# Patient Record
Sex: Male | Born: 2005 | Race: White | Hispanic: No | Marital: Single | Smoking: Never smoker
Health system: Southern US, Community
[De-identification: ages and names within clinical notes are randomized; demographics above are authoritative.]

## PROBLEM LIST (undated history)

## (undated) HISTORY — PX: STRABISMUS SURGERY: SHX218

---
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
## 2023-10-08 ENCOUNTER — Telehealth: Admitting: Adult Health

## 2023-10-08 ENCOUNTER — Encounter: Payer: Self-pay | Admitting: Adult Health

## 2023-10-08 DIAGNOSIS — F900 Attention-deficit hyperactivity disorder, predominantly inattentive type: Secondary | ICD-10-CM

## 2023-10-08 MED ORDER — AMPHETAMINE-DEXTROAMPHETAMINE 20 MG PO TABS: 20.0000 mg | ORAL_TABLET | Freq: Every day | ORAL | 0 refills | Status: DC

## 2023-10-08 NOTE — Progress Notes (Addendum)
 [facility] MD/PA/NP Initial Note  Virtual Visit via Video Note   I connected with on 10/08/23 at 17:00 AM EDT by a video enabled telemedicine application and verified that I am speaking with the correct person using two identifiers.   I discussed the limitations of evaluation and management by telemedicine and the availability of in person appointments. The patient expressed understanding and agreed to proceed.   I discussed the assessment and treatment plan with the patient. The patient was provided an opportunity to ask questions and all were answered. The patient agreed with the plan and demonstrated an understanding of the instructions.   The patient was advised to call back or seek an in-person evaluation if the symptoms worsen or if the condition fails to improve as anticipated.   I provided 60 minutes of non-face-to-face time during this encounter.  The patient was located at home.  The provider was located at [facility].     Angeline LOISE Sayers, NP    10/08/2023 5:54 PM Blake Yu  MRN:  [id number]  Chief Complaint:   HPI:  Patient seen today for initial psychiatric evaluation.   Describes mood today as ok. Pleasant. Denies tearfulness. Mood symptoms - denies depression. Reports anxiety at times. Reports irritability. Reports stable interest and motivation for the things he wants to do. Denies recent panic attacks.  Reports over thinking a lot. Reports some worry, but not a lot. Reports rumination. Reports getting frustrated a lot - more over little things. Reports mood fluctuates. Reports being tested for ADD in elementary school. His last evaluation was in 2018. Parents evaluation indicated an ADD diagnosis. He also had a psychiatric evaluation with a confirmation of dyslexia. Would like to consider options with current focus and concentration issues. Reports worsening symptoms with starting college this year. Reports he doesn't feel like he did as well as he could  and would like to explore  options to help manage ADD symptoms.  Energy levels not the best. Active, does not have a regular exercise routine.   Enjoys some usual interests and activities. Single. Spending time with family. Appetite adequate. Weight stable - 108 pounds - 65. Sleeps well most nights. Averages 6 to 8 hours. Reports focus and concentration difficulties. 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  Denies AH or VH. Denies self harm. Denies substance use.  Previous medication trials:   Wellbutrin XL 150mg  daily  Visit Diagnosis:    ICD-10-CM   1. Attention deficit hyperactivity disorder (ADHD), predominantly inattentive type  F90.0 amphetamine -dextroamphetamine  (ADDERALL) 20 MG tablet      Past Psychiatric History: Denies psychiatric hospitalization.   Past Medical History: No past medical history on file.  Past Surgical History:  Procedure Laterality Date   STRABISMUS SURGERY     Family Psychiatric History: Reports family history of mental illness.   Family History: No family history on file.  Social History:  Social History   Socioeconomic History   Marital status: Single    Spouse name: Not on file   Number of children: Not on file   Years of education: Not on file   Highest education level: Not on file  Occupational History   Not on file  Tobacco Use   Smoking status: Not on file   Smokeless tobacco: Not on file  Substance and Sexual Activity   Alcohol use: Not on file   Drug use: Not on file   Sexual activity:  Not on file  Other Topics Concern   Not on file  Social History Narrative   Not on file   Social Drivers of Health   Financial Resource Strain: Not on file  Food Insecurity: Not on file  Transportation Needs: Not on file  Physical Activity: Not on file  Stress: Not on file  Social Connections: Not on file    Allergies: No Known  Allergies  Metabolic Disorder Labs: No results found for: HGBA1C, MPG No results found for: PROLACTIN No results found for: CHOL, TRIG, HDL, CHOLHDL, VLDL, LDLCALC No results found for: TSH  Therapeutic Level Labs: No results found for: LITHIUM No results found for: VALPROATE No results found for: CBMZ  Current Medications: Current Outpatient Medications  Medication Sig Dispense Refill   amphetamine -dextroamphetamine  (ADDERALL) 20 MG tablet Take 1 tablet (20 mg total) by mouth daily. 30 tablet 0   Ibuprofen  (MOTRIN  PO) Take 10 mLs by mouth every 6 (six) hours as needed. For pain/fever     No current facility-administered medications for this visit.    Medication Side Effects: none  Orders placed this visit:  No orders of the defined types were placed in this encounter.   Psychiatric Specialty Exam:  Review of Systems  Musculoskeletal:  Negative for gait problem.  Neurological:  Negative for tremors.  Psychiatric/Behavioral:         Please refer to HPI    There were no vitals taken for this visit.There is no height or weight on file to calculate BMI.  General Appearance: Casual and Neat  Eye Contact:  Good  Speech:  Clear and Coherent and Normal Rate  Volume:  Normal  Mood:  Euthymic  Affect:  Appropriate and Congruent  Thought Process:  Coherent and Descriptions of Associations: Intact  Orientation:  Full (Time, Place, and Person)  Thought Content: Logical   Suicidal Thoughts:  No  Homicidal Thoughts:  No  Memory:  WNL  Judgement:  Good  Insight:  Good  Psychomotor Activity:  Normal  Concentration:  Concentration: Good and Attention Span: Good  Recall:  Good  Fund of Knowledge: Good  Language: Good  Assets:  Communication Skills Desire for Improvement Financial Resources/Insurance Housing Intimacy Leisure Time Physical Health Resilience Social Support Talents/Skills Transportation Vocational/Educational  ADL's:  Intact   Cognition: WNL  Prognosis:  Good   Screenings: ADD screener completed.  Receiving Psychotherapy: No   Treatment Plan/Recommendations:   Plan:  PDMP reviewed  Adderall 20mg  daily  RTC 4 weeks  Patient meets DSM criteria for ADD. Screening tools indicated ADHD likely.  60 minutes spent dedicated to the care of this patient on the date of this encounter to include pre-visit review of records, ordering of medication, post visit documentation, and face-to-face time with the patient discussing ADD. Discussed continuing current medication regimen.  Patient advised to contact office with any questions, adverse effects, or acute worsening in signs and symptoms.    Blake Fales N Raheim Beutler, NP

## 2023-10-12 NOTE — Addendum Note (Signed)
 Addended by: LAWERANCE ANGELINE SAILOR on: 10/12/2023 03:24 PM   Modules accepted: Level of Service

## 2023-11-05 ENCOUNTER — Telehealth: Admitting: Adult Health

## 2023-11-05 ENCOUNTER — Encounter: Payer: Self-pay | Admitting: Adult Health

## 2023-11-05 DIAGNOSIS — F900 Attention-deficit hyperactivity disorder, predominantly inattentive type: Secondary | ICD-10-CM | POA: Diagnosis not present

## 2023-11-05 MED ORDER — AMPHETAMINE-DEXTROAMPHET ER 30 MG PO CP24: 30.0000 mg | ORAL_CAPSULE | Freq: Every day | ORAL | 0 refills | Status: DC

## 2023-11-05 NOTE — Progress Notes (Signed)
 Blake Yu [id number] Mar 13, 2006 18 y.o.  Virtual Visit via Video Note  I connected with pt @ on 11/05/23 at  4:00 PM EDT by a video enabled telemedicine application and verified that I am speaking with the correct person using two identifiers.   I discussed the limitations of evaluation and management by telemedicine and the availability of in person appointments. The patient expressed understanding and agreed to proceed.  I discussed the assessment and treatment plan with the patient. The patient was provided an opportunity to ask questions and all were answered. The patient agreed with the plan and demonstrated an understanding of the instructions.   The patient was advised to call back or seek an in-person evaluation if the symptoms worsen or if the condition fails to improve as anticipated.  I provided 20 minutes of non-face-to-face time during this encounter.  The patient was located at home.  The provider was located at [facility].   Angeline LOISE Sayers, NP   Subjective:   Patient ID:  Blake Yu is a 18 y.o. (DOB 09-20-2005) male.  Chief Complaint: No chief complaint on file.   HPI Blake Yu presents for follow-up of ADD.  Describes mood today as ok. Pleasant. Denies tearfulness. Mood symptoms - denies depression. Reports anxiety at times. Reports less irritability. Reports stable interest and motivation. Denies recent panic attacks. Reports decreased over thinking better with medication. Denies worry and rumination. Reports getting less frustrated when on medication. Reports mood as stable. Stating I feel like I'm doing better. Feels like the addition of Adderall has been helpful, but would like to try the XR formula.  Energy levels improved. Active, does not have a regular exercise routine.   Enjoys some usual interests and activities. Single. Spending time with family. Appetite adequate. Weight stable - 108 pounds - 65. Sleeps well most nights. Averages 6  to 8 hours. Reports focus and concentration has improved. 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  Denies AH or VH. Denies self harm. Denies substance use.  Previous medication trials:   Wellbutrin XL 150mg  dail    Review of Systems:  Review of Systems  Musculoskeletal:  Negative for gait problem.  Neurological:  Negative for tremors.  Psychiatric/Behavioral:         Please refer to HPI    Medications: I have reviewed the patient's current medications.  Current Outpatient Medications  Medication Sig Dispense Refill   amphetamine -dextroamphetamine  (ADDERALL XR) 30 MG 24 hr capsule Take 1 capsule (30 mg total) by mouth daily. 30 capsule 0   Ibuprofen  (MOTRIN  PO) Take 10 mLs by mouth every 6 (six) hours as needed. For pain/fever     No current facility-administered medications for this visit.    Medication Side Effects: None  Allergies: No Known Allergies  No past medical history on file.  No family history on file.  Social History   Socioeconomic History   Marital status: Single    Spouse name: Not on file   Number of children: Not on file   Years of education: Not on file   Highest education level: Not on file  Occupational History   Not on file  Tobacco Use   Smoking status: Never   Smokeless tobacco: Not on file  Substance and Sexual Activity   Alcohol use: Not on file   Drug use: Not on file   Sexual activity: Not on file  Other Topics Concern  Not on file  Social History Narrative   Not on file   Social Drivers of Health   Financial Resource Strain: Not on file  Food Insecurity: Not on file  Transportation Needs: Not on file  Physical Activity: Not on file  Stress: Not on file  Social Connections: Not on file  Intimate Partner Violence: Not on file    Past Medical History, Surgical history, Social history, and Family history were reviewed and updated as  appropriate.   Please see review of systems for further details on the patient's review from today.   Objective:   Physical Exam:  There were no vitals taken for this visit.  Physical Exam Constitutional:      General: He is not in acute distress. Musculoskeletal:        General: No deformity.  Neurological:     Mental Status: He is alert and oriented to person, place, and time.     Coordination: Coordination normal.  Psychiatric:        Attention and Perception: Attention and perception normal. He does not perceive auditory or visual hallucinations.        Mood and Affect: Mood normal. Mood is not anxious or depressed. Affect is not labile, blunt, angry or inappropriate.        Speech: Speech normal.        Behavior: Behavior normal.        Thought Content: Thought content normal. Thought content is not paranoid or delusional. Thought content does not include homicidal or suicidal ideation. Thought content does not include homicidal or suicidal plan.        Cognition and Memory: Cognition and memory normal.        Judgment: Judgment normal.     Comments: Insight intact     Lab Review:  No results found for: NA, K, CL, CO2, GLUCOSE, BUN, CREATININE, CALCIUM, PROT, ALBUMIN, AST, ALT, ALKPHOS, BILITOT, GFRNONAA, GFRAA  No results found for: WBC, RBC, HGB, HCT, PLT, MCV, MCH, MCHC, RDW, LYMPHSABS, MONOABS, EOSABS, BASOSABS  No results found for: POCLITH, LITHIUM   No results found for: PHENYTOIN, PHENOBARB, VALPROATE, CBMZ   .res Assessment: Plan:    Treatment Plan/Recommendations:   Plan:  PDMP reviewed  D/C Adderall 20mg  daily  Add Adderall XR 30mg  daily  RTC 4 weeks  Patient meets DSM criteria for ADD. Screening tools indicated ADHD likely.  20 minutes spent dedicated to the care of this patient on the date of this encounter to include pre-visit review of records, ordering of medication, post  visit documentation, and face-to-face time with the patient discussing ADD. Discussed continuing current medication regimen.  Patient advised to contact office with any questions, adverse effects, or acute worsening in signs and symptoms.     Diagnoses and all orders for this visit:  Attention deficit hyperactivity disorder (ADHD), predominantly inattentive type -     amphetamine -dextroamphetamine  (ADDERALL XR) 30 MG 24 hr capsule; Take 1 capsule (30 mg total) by mouth daily.     Please see After Visit Summary for patient specific instructions.  No future appointments.  No orders of the defined types were placed in this encounter.     -------------------------------

## 2023-12-08 ENCOUNTER — Telehealth (INDEPENDENT_AMBULATORY_CARE_PROVIDER_SITE_OTHER): Admitting: Adult Health

## 2023-12-08 ENCOUNTER — Encounter: Payer: Self-pay | Admitting: Adult Health

## 2023-12-08 DIAGNOSIS — F411 Generalized anxiety disorder: Secondary | ICD-10-CM | POA: Diagnosis not present

## 2023-12-08 DIAGNOSIS — F900 Attention-deficit hyperactivity disorder, predominantly inattentive type: Secondary | ICD-10-CM

## 2023-12-08 MED ORDER — AMPHETAMINE-DEXTROAMPHET ER 30 MG PO CP24: 30.0000 mg | ORAL_CAPSULE | Freq: Every day | ORAL | 0 refills | Status: AC

## 2023-12-08 MED ORDER — PROPRANOLOL HCL 10 MG PO TABS: 10.0000 mg | ORAL_TABLET | Freq: Three times a day (TID) | ORAL | 0 refills | Status: DC

## 2023-12-08 MED ORDER — AMPHETAMINE-DEXTROAMPHET ER 30 MG PO CP24: 30.0000 mg | ORAL_CAPSULE | Freq: Every day | ORAL | 0 refills | Status: DC

## 2023-12-08 NOTE — Progress Notes (Signed)
 Blake Yu [id number] [birth year] 18 y.o.  Virtual Visit via Video Note  I connected with pt @ on 12/08/23 at  4:00 PM EDT by a video enabled telemedicine application and verified that I am speaking with the correct person using two identifiers.   I discussed the limitations of evaluation and management by telemedicine and the availability of in person appointments. The patient expressed understanding and agreed to proceed.  I discussed the assessment and treatment plan with the patient. The patient was provided an opportunity to ask questions and all were answered. The patient agreed with the plan and demonstrated an understanding of the instructions.   The patient was advised to call back or seek an in-person evaluation if the symptoms worsen or if the condition fails to improve as anticipated.  I provided 20 minutes of non-face-to-face time during this encounter.  The patient was located at home.  The provider was located at [facility].   Blake LOISE Sayers, NP   Subjective:   Patient ID:  Blake Yu is a 18 y.o. (DOB 05/21/2005) male.  Chief Complaint: No chief complaint on file.   HPI Blake Yu presents for follow-up of ADD.  Describes mood today as ok. Pleasant. Denies tearfulness. Mood symptoms - denies depression. Reports anxiety at times - social. Reports decreased irritability - only on days I don't take the Adderall. Reports stable interest and motivation. Denies recent panic attacks. Reports decreased over thinking. Denies worry and rumination. Reports some frustration. Reports mood as stable. Stating I feel like I'm doing ok - getting adjusted. Feels like the addition of Adderall XR formula works well. Energy levels improved. Active, does not have a regular exercise routine.   Enjoys some usual interests and activities. Single. Living with roommates. Spending time with family. Appetite adequate. Weight stable - 108 pounds - 65. Sleeps well most nights.  Averages 6 to 8 hours. Reports focus and concentration has improved. 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  Denies AH or VH. Denies self harm. Denies substance use.  Previous medication trials:   Wellbutrin XL 150mg  dail  Review of Systems:  Review of Systems  Musculoskeletal:  Negative for gait problem.  Neurological:  Negative for tremors.  Psychiatric/Behavioral:         Please refer to HPI    Medications: I have reviewed the patient's current medications.  Current Outpatient Medications  Medication Sig Dispense Refill   [START ON 01/05/2024] amphetamine -dextroamphetamine  (ADDERALL XR) 30 MG 24 hr capsule Take 1 capsule (30 mg total) by mouth daily. 30 capsule 0   [START ON 02/02/2024] amphetamine -dextroamphetamine  (ADDERALL XR) 30 MG 24 hr capsule Take 1 capsule (30 mg total) by mouth daily. 30 capsule 0   propranolol  (INDERAL ) 10 MG tablet Take 1 tablet (10 mg total) by mouth 3 (three) times daily. 90 tablet 0   amphetamine -dextroamphetamine  (ADDERALL XR) 30 MG 24 hr capsule Take 1 capsule (30 mg total) by mouth daily. 30 capsule 0   Ibuprofen  (MOTRIN  PO) Take 10 mLs by mouth every 6 (six) hours as needed. For pain/fever     No current facility-administered medications for this visit.    Medication Side Effects: None  Allergies: No Known Allergies  No past medical history on file.  No family history on file.  Social History   Socioeconomic History   Marital status: Single    Spouse name: Not on file   Number of children: Not  on file   Years of education: Not on file   Highest education level: Not on file  Occupational History   Not on file  Tobacco Use   Smoking status: Never   Smokeless tobacco: Not on file  Substance and Sexual Activity   Alcohol use: Not on file   Drug use: Not on file   Sexual activity: Not on file  Other Topics Concern   Not on file  Social  History Narrative   Not on file   Social Drivers of Health   Financial Resource Strain: Not on file  Food Insecurity: Not on file  Transportation Needs: Not on file  Physical Activity: Not on file  Stress: Not on file  Social Connections: Not on file  Intimate Partner Violence: Not on file    Past Medical History, Surgical history, Social history, and Family history were reviewed and updated as appropriate.   Please see review of systems for further details on the patient's review from today.   Objective:   Physical Exam:  There were no vitals taken for this visit.  Physical Exam Constitutional:      General: He is not in acute distress. Musculoskeletal:        General: No deformity.  Neurological:     Mental Status: He is alert and oriented to person, place, and time.     Coordination: Coordination normal.  Psychiatric:        Attention and Perception: Attention and perception normal. He does not perceive auditory or visual hallucinations.        Mood and Affect: Mood normal. Mood is not anxious or depressed. Affect is not labile, blunt, angry or inappropriate.        Speech: Speech normal.        Behavior: Behavior normal.        Thought Content: Thought content normal. Thought content is not paranoid or delusional. Thought content does not include homicidal or suicidal ideation. Thought content does not include homicidal or suicidal plan.        Cognition and Memory: Cognition and memory normal.        Judgment: Judgment normal.     Comments: Insight intact     Lab Review:  No results found for: NA, K, CL, CO2, GLUCOSE, BUN, CREATININE, CALCIUM, PROT, ALBUMIN, AST, ALT, ALKPHOS, BILITOT, GFRNONAA, GFRAA  No results found for: WBC, RBC, HGB, HCT, PLT, MCV, MCH, MCHC, RDW, LYMPHSABS, MONOABS, EOSABS, BASOSABS  No results found for: POCLITH, LITHIUM   No results found for: PHENYTOIN, PHENOBARB,  VALPROATE, CBMZ   .res Assessment: Plan:    Treatment Plan/Recommendations:   Plan:  PDMP reviewed  Adderall XR 30mg  daily - sent 3 months supply  Add Propranolol  10mg  TID prn anxiety  RTC 4 weeks  Patient meets DSM criteria for ADD. Screening tools indicated ADHD likely.  20 minutes spent dedicated to the care of this patient on the date of this encounter to include pre-visit review of records, ordering of medication, post visit documentation, and face-to-face time with the patient discussing ADD. Discussed continuing current medication regimen.  Patient advised to contact office with any questions, adverse effects, or acute worsening in signs and symptoms.    Diagnoses and all orders for this visit:  Attention deficit hyperactivity disorder (ADHD), predominantly inattentive type -     amphetamine -dextroamphetamine  (ADDERALL XR) 30 MG 24 hr capsule; Take 1 capsule (30 mg total) by mouth daily. -     amphetamine -dextroamphetamine  (ADDERALL XR) 30 MG  24 hr capsule; Take 1 capsule (30 mg total) by mouth daily. -     amphetamine -dextroamphetamine  (ADDERALL XR) 30 MG 24 hr capsule; Take 1 capsule (30 mg total) by mouth daily.  Generalized anxiety disorder -     propranolol  (INDERAL ) 10 MG tablet; Take 1 tablet (10 mg total) by mouth 3 (three) times daily.     Please see After Visit Summary for patient specific instructions.  No future appointments.   No orders of the defined types were placed in this encounter.     -------------------------------

## 2023-12-10 ENCOUNTER — Telehealth: Payer: Self-pay | Admitting: Adult Health

## 2023-12-10 DIAGNOSIS — F411 Generalized anxiety disorder: Secondary | ICD-10-CM

## 2023-12-10 NOTE — Telephone Encounter (Signed)
 Pt wanted to speak with [facility] about the dosing of his Adderall. Wanting to know if he can get booster for Adderall and SSRI.

## 2023-12-11 ENCOUNTER — Other Ambulatory Visit: Payer: Self-pay

## 2023-12-11 DIAGNOSIS — F900 Attention-deficit hyperactivity disorder, predominantly inattentive type: Secondary | ICD-10-CM

## 2023-12-11 MED ORDER — AMPHETAMINE-DEXTROAMPHETAMINE 10 MG PO TABS: 10.0000 mg | ORAL_TABLET | Freq: Every day | ORAL | 0 refills | Status: DC

## 2023-12-11 MED ORDER — FLUOXETINE HCL 20 MG PO CAPS: 20.0000 mg | ORAL_CAPSULE | Freq: Every day | ORAL | 0 refills | Status: DC

## 2023-12-11 NOTE — Telephone Encounter (Signed)
 Sent in Rx for Prozac , pended Adderall.

## 2023-12-11 NOTE — Telephone Encounter (Signed)
 Pt just seen earlier this week. He is asking for a boost of Adderall IR, doesn't feel like the current 30 XR dose is strong enough. He is also asking for an SSRI. He said at appt he elected to try propranolol , but doesn't feel like it is addressing his anxiety. He rates anxiety as 7-9/10. He reports it is 7 on a daily basis and 9 situational. He reports he has stopped caffeine. He is sleeping 6-8 hours.

## 2024-01-05 ENCOUNTER — Encounter: Payer: Self-pay | Admitting: Adult Health

## 2024-01-05 ENCOUNTER — Telehealth: Admitting: Adult Health

## 2024-01-05 DIAGNOSIS — F900 Attention-deficit hyperactivity disorder, predominantly inattentive type: Secondary | ICD-10-CM

## 2024-01-05 DIAGNOSIS — F411 Generalized anxiety disorder: Secondary | ICD-10-CM | POA: Diagnosis not present

## 2024-01-05 DIAGNOSIS — F909 Attention-deficit hyperactivity disorder, unspecified type: Secondary | ICD-10-CM

## 2024-01-05 MED ORDER — PROPRANOLOL HCL 10 MG PO TABS: 10.0000 mg | ORAL_TABLET | Freq: Three times a day (TID) | ORAL | 2 refills | Status: DC

## 2024-01-05 MED ORDER — FLUOXETINE HCL 20 MG PO CAPS: 20.0000 mg | ORAL_CAPSULE | Freq: Every day | ORAL | 2 refills | Status: DC

## 2024-01-05 MED ORDER — AMPHETAMINE-DEXTROAMPHETAMINE 10 MG PO TABS: 10.0000 mg | ORAL_TABLET | Freq: Every day | ORAL | 0 refills | Status: DC

## 2024-01-05 NOTE — Progress Notes (Signed)
 Blake Yu [id number] 13-Dec-2005 18 y.o.  Virtual Visit via Video Note  I connected with pt @ on 01/05/24 at  4:30 PM EDT by a video enabled telemedicine application and verified that I am speaking with the correct person using two identifiers.   I discussed the limitations of evaluation and management by telemedicine and the availability of in person appointments. The patient expressed understanding and agreed to proceed.  I discussed the assessment and treatment plan with the patient. The patient was provided an opportunity to ask questions and all were answered. The patient agreed with the plan and demonstrated an understanding of the instructions.   The patient was advised to call back or seek an in-person evaluation if the symptoms worsen or if the condition fails to improve as anticipated.  I provided 25 minutes of non-face-to-face time during this encounter.  The patient was located at home.  The provider was located at [facility].   Angeline LOISE Sayers, NP   Subjective:   Patient ID:  Blake Yu is a 18 y.o. (DOB [birth year]) male.  Chief Complaint: No chief complaint on file.   HPI Blake Yu presents for follow-up of ADD and GAD.  Describes mood today as ok. Pleasant. Denies tearfulness. Mood symptoms - reports decreased depression. Reports the general anxiety he was feeling has gone away. Denies panic attacks. Reports decreased irritability - feel more patient. Reports stable interest and motivation - overall good. Denies over thinking. Denies worry and rumination. Reports some frustration - over little stuff.  Reports mood as stable. Stating I feel like I'm doing ok. Feels like the current medication regimen is helpful. Taking medication as prescribing. Energy levels stable. Active, does not have a regular exercise routine.   Enjoys some usual interests and activities. Single. Living with roommates. Spending time with family. Appetite adequate. Weight  stable - 108 to 105 pounds - 65. Sleeps well most nights. Averages 6 to 8 hours. Reports focus and concentration is stable with current medication regimen. 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  Denies AH or VH. Denies self harm. Denies substance use.  Previous medication trials:   Wellbutrin XL 150mg  dail  Review of Systems:  Review of Systems  Musculoskeletal:  Negative for gait problem.  Neurological:  Negative for tremors.  Psychiatric/Behavioral:         Please refer to HPI    Medications: I have reviewed the patient's current medications.  Current Outpatient Medications  Medication Sig Dispense Refill   amphetamine -dextroamphetamine  (ADDERALL XR) 30 MG 24 hr capsule Take 1 capsule (30 mg total) by mouth daily. 30 capsule 0   amphetamine -dextroamphetamine  (ADDERALL XR) 30 MG 24 hr capsule Take 1 capsule (30 mg total) by mouth daily. 30 capsule 0   [START ON 02/02/2024] amphetamine -dextroamphetamine  (ADDERALL XR) 30 MG 24 hr capsule Take 1 capsule (30 mg total) by mouth daily. 30 capsule 0   amphetamine -dextroamphetamine  (ADDERALL) 10 MG tablet Take 1 tablet (10 mg total) by mouth daily at 12 noon. 30 tablet 0   FLUoxetine  (PROZAC ) 20 MG capsule Take 1 capsule (20 mg total) by mouth daily. 30 capsule 0   Ibuprofen  (MOTRIN  PO) Take 10 mLs by mouth every 6 (six) hours as needed. For pain/fever     propranolol  (INDERAL ) 10 MG tablet Take 1 tablet (10 mg total) by mouth 3 (three) times daily. 90 tablet 0   No current facility-administered medications for this visit.  Medication Side Effects: None  Allergies: No Known Allergies  No past medical history on file.  No family history on file.  Social History   Socioeconomic History   Marital status: Single    Spouse name: Not on file   Number of children: Not on file   Years of education: Not on file   Highest education level: Not  on file  Occupational History   Not on file  Tobacco Use   Smoking status: Never   Smokeless tobacco: Not on file  Substance and Sexual Activity   Alcohol use: Not on file   Drug use: Not on file   Sexual activity: Not on file  Other Topics Concern   Not on file  Social History Narrative   Not on file   Social Drivers of Health   Financial Resource Strain: Not on file  Food Insecurity: Not on file  Transportation Needs: Not on file  Physical Activity: Not on file  Stress: Not on file  Social Connections: Not on file  Intimate Partner Violence: Not on file    Past Medical History, Surgical history, Social history, and Family history were reviewed and updated as appropriate.   Please see review of systems for further details on the patient's review from today.   Objective:   Physical Exam:  There were no vitals taken for this visit.  Physical Exam Constitutional:      General: He is not in acute distress. Musculoskeletal:        General: No deformity.  Neurological:     Mental Status: He is alert and oriented to person, place, and time.     Coordination: Coordination normal.  Psychiatric:        Attention and Perception: Attention and perception normal. He does not perceive auditory or visual hallucinations.        Mood and Affect: Mood normal. Mood is not anxious or depressed. Affect is not labile, blunt, angry or inappropriate.        Speech: Speech normal.        Behavior: Behavior normal.        Thought Content: Thought content normal. Thought content is not paranoid or delusional. Thought content does not include homicidal or suicidal ideation. Thought content does not include homicidal or suicidal plan.        Cognition and Memory: Cognition and memory normal.        Judgment: Judgment normal.     Comments: Insight intact     Lab Review:  No results found for: NA, K, CL, CO2, GLUCOSE, BUN, CREATININE, CALCIUM, PROT, ALBUMIN, AST,  ALT, ALKPHOS, BILITOT, GFRNONAA, GFRAA  No results found for: WBC, RBC, HGB, HCT, PLT, MCV, MCH, MCHC, RDW, LYMPHSABS, MONOABS, EOSABS, BASOSABS  No results found for: POCLITH, LITHIUM   No results found for: PHENYTOIN, PHENOBARB, VALPROATE, CBMZ   .res Assessment: Plan:    Treatment Plan/Recommendations:   Plan:  PDMP reviewed  Adderall XR 30mg  daily - sent 3 months supply Adderall 10mg  daily Propranolol  10mg  TID prn anxiety Prozac  20mg  daily  RTC 4 weeks  Patient meets DSM criteria for ADD. Screening tools indicated ADHD likely.  20 minutes spent dedicated to the care of this patient on the date of this encounter to include pre-visit review of records, ordering of medication, post visit documentation, and face-to-face time with the patient discussing ADD. Discussed continuing current medication regimen.  Patient advised to contact office with any questions, adverse effects, or acute worsening in signs and  symptoms.  There are no diagnoses linked to this encounter.   Please see After Visit Summary for patient specific instructions.  Future Appointments  Date Time Provider Department Center  01/05/2024  4:30 PM Kj Imbert Nattalie, NP [facility] None    No orders of the defined types were placed in this encounter.     -------------------------------

## 2024-02-03 ENCOUNTER — Encounter: Payer: Self-pay | Admitting: Adult Health

## 2024-02-03 ENCOUNTER — Telehealth: Admitting: Adult Health

## 2024-02-03 DIAGNOSIS — F411 Generalized anxiety disorder: Secondary | ICD-10-CM

## 2024-02-03 DIAGNOSIS — F909 Attention-deficit hyperactivity disorder, unspecified type: Secondary | ICD-10-CM | POA: Diagnosis not present

## 2024-02-03 DIAGNOSIS — F900 Attention-deficit hyperactivity disorder, predominantly inattentive type: Secondary | ICD-10-CM

## 2024-02-03 MED ORDER — FLUOXETINE HCL 40 MG PO CAPS: 40.0000 mg | ORAL_CAPSULE | Freq: Every day | ORAL | 2 refills | Status: DC

## 2024-02-03 MED ORDER — PROPRANOLOL HCL 10 MG PO TABS: 10.0000 mg | ORAL_TABLET | Freq: Three times a day (TID) | ORAL | 2 refills | Status: DC

## 2024-02-03 MED ORDER — AMPHETAMINE-DEXTROAMPHETAMINE 10 MG PO TABS: 10.0000 mg | ORAL_TABLET | Freq: Every day | ORAL | 0 refills | Status: DC

## 2024-02-03 NOTE — Progress Notes (Signed)
 Blake Yu [id number] 01-Dec-2005 18 y.o.  Virtual Visit via Video Note  I connected with pt @ on 02/03/24 at  4:30 PM EDT by a video enabled telemedicine application and verified that I am speaking with the correct person using two identifiers.   I discussed the limitations of evaluation and management by telemedicine and the availability of in person appointments. The patient expressed understanding and agreed to proceed.  I discussed the assessment and treatment plan with the patient. The patient was provided an opportunity to ask questions and all were answered. The patient agreed with the plan and demonstrated an understanding of the instructions.   The patient was advised to call back or seek an in-person evaluation if the symptoms worsen or if the condition fails to improve as anticipated.  I provided 25 minutes of non-face-to-face time during this encounter.  The patient was located at home.  The provider was located at [facility].   Angeline LOISE Sayers, NP   Subjective:   Patient ID:  Blake Yu is a 18 y.o. (DOB [birth year]) male.  Chief Complaint: No chief complaint on file.   HPI Blake Yu presents for follow-up of ADD and GAD.  Describes mood today as ok. Pleasant. Denies tearfulness. Mood symptoms - denies depression. Reports anxiety at times - more generalized. Denies panic attacks. Reports decreased irritability. Reports stable interest and motivation. Reports some over thinking. Denies worry and rumination. Reports being at a party - drinking alcohol - my limit - liquor and beer - said something he should not have and it ruined the atmosphere. Reports panic attacks. Reports increased worry and rumination. Reports mood as stable. Stating I feel like I'm doing ok. Feels like the current medication regimen is helpful. Taking medication as prescribing. Energy levels stable. Active, does not have a regular exercise routine.   Enjoys some usual interests and  activities. Single. Living with roommates. Spending time with family. Appetite adequate. Weight stable - 105 pounds - 65. Sleeps well most nights. Averages 6 to 8 hours. Reports focus and concentration is stable with current medication regimen. 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 Denies SI or HI.  Denies AH or VH. Denies self harm. Denies substance use.  Previous medication trials:   Wellbutrin XL 150mg  dail    Review of Systems:  Review of Systems  Musculoskeletal:  Negative for gait problem.  Neurological:  Negative for tremors.  Psychiatric/Behavioral:         Please refer to HPI    Medications: I have reviewed the patient's current medications.  Current Outpatient Medications  Medication Sig Dispense Refill   amphetamine -dextroamphetamine  (ADDERALL XR) 30 MG 24 hr capsule Take 1 capsule (30 mg total) by mouth daily. 30 capsule 0   amphetamine -dextroamphetamine  (ADDERALL XR) 30 MG 24 hr capsule Take 1 capsule (30 mg total) by mouth daily. 30 capsule 0   amphetamine -dextroamphetamine  (ADDERALL XR) 30 MG 24 hr capsule Take 1 capsule (30 mg total) by mouth daily. 30 capsule 0   amphetamine -dextroamphetamine  (ADDERALL) 10 MG tablet Take 1 tablet (10 mg total) by mouth daily at 12 noon. 30 tablet 0   FLUoxetine  (PROZAC ) 20 MG capsule Take 1 capsule (20 mg total) by mouth daily. 30 capsule 2   Ibuprofen  (MOTRIN  PO) Take 10 mLs by mouth every 6 (six) hours as needed. For pain/fever     propranolol  (  INDERAL ) 10 MG tablet Take 1 tablet (10 mg total) by mouth 3 (three) times daily. 90 tablet 2   No current facility-administered medications for this visit.    Medication Side Effects: None  Allergies: No Known Allergies  No past medical history on file.  No family history on file.  Social History   Socioeconomic History    Marital status: Single    Spouse name: Not on file   Number of children: Not on file   Years of education: Not on file   Highest education level: Not on file  Occupational History   Not on file  Tobacco Use   Smoking status: Never   Smokeless tobacco: Not on file  Substance and Sexual Activity   Alcohol use: Not on file   Drug use: Not on file   Sexual activity: Not on file  Other Topics Concern   Not on file  Social History Narrative   Not on file   Social Drivers of Health   Financial Resource Strain: Not on file  Food Insecurity: Not on file  Transportation Needs: Not on file  Physical Activity: Not on file  Stress: Not on file  Social Connections: Not on file  Intimate Partner Violence: Not on file    Past Medical History, Surgical history, Social history, and Family history were reviewed and updated as appropriate.   Please see review of systems for further details on the patient's review from today.   Objective:   Physical Exam:  There were no vitals taken for this visit.  Physical Exam Constitutional:      General: He is not in acute distress. Musculoskeletal:        General: No deformity.  Neurological:     Mental Status: He is alert and oriented to person, place, and time.     Coordination: Coordination normal.  Psychiatric:        Attention and Perception: Attention and perception normal. He does not perceive auditory or visual hallucinations.        Mood and Affect: Mood normal. Mood is not anxious or depressed. Affect is not labile, blunt, angry or inappropriate.        Speech: Speech normal.        Behavior: Behavior normal.        Thought Content: Thought content normal. Thought content is not paranoid or delusional. Thought content does not include homicidal or suicidal ideation. Thought content does not include homicidal or suicidal plan.        Cognition and Memory: Cognition and memory normal.        Judgment: Judgment normal.     Comments:  Insight intact     Lab Review:  No results found for: NA, K, CL, CO2, GLUCOSE, BUN, CREATININE, CALCIUM, PROT, ALBUMIN, AST, ALT, ALKPHOS, BILITOT, GFRNONAA, GFRAA  No results found for: WBC, RBC, HGB, HCT, PLT, MCV, MCH, MCHC, RDW, LYMPHSABS, MONOABS, EOSABS, BASOSABS  No results found for: POCLITH, LITHIUM   No results found for: PHENYTOIN, PHENOBARB, VALPROATE, CBMZ   .res Assessment: Plan:    Treatment Plan/Recommendations:   Plan:  PDMP reviewed  Adderall XR 30mg  daily - sent 3 months supply Adderall 10mg  daily  Propranolol  10mg  TID prn anxiety  Increase Prozac  20mg  to 40mg  daily  RTC 4 weeks  Patient meets DSM criteria for ADD. Screening tools indicated ADHD likely.  25 minutes spent dedicated to the care of this patient on the date of this encounter to include pre-visit review of records,  ordering of medication, post visit documentation, and face-to-face time with the patient discussing ADD. Discussed continuing current medication regimen.  Patient advised to contact office with any questions, adverse effects, or acute worsening in signs and symptoms.   There are no diagnoses linked to this encounter.   Please see After Visit Summary for patient specific instructions.  Future Appointments  Date Time Provider Department Center  02/03/2024  4:30 PM Everlie Eble Nattalie, NP [facility] None    No orders of the defined types were placed in this encounter.     -------------------------------

## 2024-03-09 ENCOUNTER — Encounter: Payer: Self-pay | Admitting: Adult Health

## 2024-03-09 ENCOUNTER — Telehealth (INDEPENDENT_AMBULATORY_CARE_PROVIDER_SITE_OTHER): Admitting: Adult Health

## 2024-03-09 DIAGNOSIS — F411 Generalized anxiety disorder: Secondary | ICD-10-CM | POA: Diagnosis not present

## 2024-03-09 DIAGNOSIS — F900 Attention-deficit hyperactivity disorder, predominantly inattentive type: Secondary | ICD-10-CM

## 2024-03-09 MED ORDER — AMPHETAMINE-DEXTROAMPHET ER 30 MG PO CP24: 30.0000 mg | ORAL_CAPSULE | Freq: Every day | ORAL | 0 refills | Status: AC

## 2024-03-09 MED ORDER — AMPHETAMINE-DEXTROAMPHETAMINE 20 MG PO TABS: ORAL_TABLET | ORAL | 0 refills | Status: DC

## 2024-03-09 MED ORDER — BUSPIRONE HCL 5 MG PO TABS: 5.0000 mg | ORAL_TABLET | Freq: Three times a day (TID) | ORAL | 2 refills | Status: DC

## 2024-03-09 NOTE — Progress Notes (Signed)
 Blake Yu [id number] 01-Jan-2006 18 y.o.  Virtual Visit via Video Note  I connected with pt @ on 03/09/24 at  9:30 AM EST by a video enabled telemedicine application and verified that I am speaking with the correct person using two identifiers.   I discussed the limitations of evaluation and management by telemedicine and the availability of in person appointments. The patient expressed understanding and agreed to proceed.  I discussed the assessment and treatment plan with the patient. The patient was provided an opportunity to ask questions and all were answered. The patient agreed with the plan and demonstrated an understanding of the instructions.   The patient was advised to call back or seek an in-person evaluation if the symptoms worsen or if the condition fails to improve as anticipated.  I provided 25 minutes of non-face-to-face time during this encounter.  The patient was located at home.  The provider was located at [facility].   Angeline LOISE Sayers, NP   Subjective:   Patient ID:  Blake Yu is a 18 y.o. (DOB 15-May-2005) male.  Chief Complaint: No chief complaint on file.   HPI Blake Yu presents for follow-up of ADD and GAD.  Describes mood today as ok. Pleasant. Denies tearfulness. Mood symptoms - denies depression and anxiety. Reports some social anxiety. Denies panic attacks. Reports increased irritability. Reports stable interest and motivation. Reports some procrastination - schoolwork. Denies over thinking. Denies worry and rumination. Reports mood as variable - some days are easy and other days are sporadic. Stating in general, I feel like I'm doing ok. Feels like the current medication regimen is helpful, but would like to consider other options. Taking medication as prescribed. Energy levels stable. Active, does not have a regular exercise routine.   Enjoys some usual interests and activities. Single. Living with roommates. Spending time with  family. Appetite adequate. Weight stable - 108 pounds - 65. Sleeps well most nights. Averages 6 to 8 hours. Reports focus and concentration difficulties - Adderall not as helpful with current schedule. Completing tasks. 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 Denies SI or HI.  Denies AH or VH. Denies self harm. Denies substance use.  Previous medication trials:   Wellbutrin XL 150mg  dail  Review of Systems:  Review of Systems  Musculoskeletal:  Negative for gait problem.  Neurological:  Negative for tremors.  Psychiatric/Behavioral:         Please refer to HPI    Medications: I have reviewed the patient's current medications.  Current Outpatient Medications  Medication Sig Dispense Refill   busPIRone  (BUSPAR ) 5 MG tablet Take 1 tablet (5 mg total) by mouth 3 (three) times daily. 90 tablet 2   amphetamine -dextroamphetamine  (ADDERALL XR) 30 MG 24 hr capsule Take 1 capsule (30 mg total) by mouth daily. 30 capsule 0   amphetamine -dextroamphetamine  (ADDERALL XR) 30 MG 24 hr capsule Take 1 capsule (30 mg total) by mouth daily. 30 capsule 0   amphetamine -dextroamphetamine  (ADDERALL XR) 30 MG 24 hr capsule Take 1 capsule (30 mg total) by mouth daily. 30 capsule 0   amphetamine -dextroamphetamine  (ADDERALL) 20 MG tablet Take 1/2 to one tablet daily as needed. 30 tablet 0   FLUoxetine  (PROZAC ) 40 MG capsule Take 1 capsule (40 mg total) by mouth daily. 30 capsule 2   Ibuprofen  (MOTRIN  PO) Take 10 mLs by mouth every 6 (six) hours as needed. For pain/fever  propranolol  (INDERAL ) 10 MG tablet Take 1 tablet (10 mg total) by mouth 3 (three) times daily. 90 tablet 2   No current facility-administered medications for this visit.    Medication Side Effects: None and Headache  Allergies: No Known Allergies  No past medical history on file.  No family history on file.  Social History    Socioeconomic History   Marital status: Single    Spouse name: Not on file   Number of children: Not on file   Years of education: Not on file   Highest education level: Not on file  Occupational History   Not on file  Tobacco Use   Smoking status: Never   Smokeless tobacco: Not on file  Substance and Sexual Activity   Alcohol use: Not on file   Drug use: Not on file   Sexual activity: Not on file  Other Topics Concern   Not on file  Social History Narrative   Not on file   Social Drivers of Health   Financial Resource Strain: Not on file  Food Insecurity: Not on file  Transportation Needs: Not on file  Physical Activity: Not on file  Stress: Not on file  Social Connections: Not on file  Intimate Partner Violence: Not on file    Past Medical History, Surgical history, Social history, and Family history were reviewed and updated as appropriate.   Please see review of systems for further details on the patient's review from today.   Objective:   Physical Exam:  There were no vitals taken for this visit.  Physical Exam Constitutional:      General: He is not in acute distress. Musculoskeletal:        General: No deformity.  Neurological:     Mental Status: He is alert and oriented to person, place, and time.     Coordination: Coordination normal.  Psychiatric:        Attention and Perception: Attention and perception normal. He does not perceive auditory or visual hallucinations.        Mood and Affect: Mood normal. Mood is not anxious or depressed. Affect is not labile, blunt, angry or inappropriate.        Speech: Speech normal.        Behavior: Behavior normal.        Thought Content: Thought content normal. Thought content is not paranoid or delusional. Thought content does not include homicidal or suicidal ideation. Thought content does not include homicidal or suicidal plan.        Cognition and Memory: Cognition and memory normal.        Judgment:  Judgment normal.     Comments: Insight intact     Lab Review:  No results found for: NA, K, CL, CO2, GLUCOSE, BUN, CREATININE, CALCIUM, PROT, ALBUMIN, AST, ALT, ALKPHOS, BILITOT, GFRNONAA, GFRAA  No results found for: WBC, RBC, HGB, HCT, PLT, MCV, MCH, MCHC, RDW, LYMPHSABS, MONOABS, EOSABS, BASOSABS  No results found for: POCLITH, LITHIUM   No results found for: PHENYTOIN, PHENOBARB, VALPROATE, CBMZ   .res Assessment: Plan:    Treatment Plan/Recommendations:   Plan:  PDMP reviewed  Adderall XR 30mg  daily   Increase Adderall 10mg  to 20mg  daily  Prozac  40mg  daily  Add Buspar  5mg  TID for anxiety D/C Propranolol  10mg  TID prn anxiety  RTC 4 weeks  Patient meets DSM criteria for ADD. Screening tools indicated ADHD likely.  25 minutes spent dedicated to the care of this patient on the date of this  encounter to include pre-visit review of records, ordering of medication, post visit documentation, and face-to-face time with the patient discussing ADD. Discussed continuing current medication regimen.  Patient advised to contact office with any questions, adverse effects, or acute worsening in signs and symptoms.  Diagnoses and all orders for this visit:  Attention deficit hyperactivity disorder (ADHD), predominantly inattentive type -     Discontinue: amphetamine -dextroamphetamine  (ADDERALL) 20 MG tablet; Take 1/2 to one tablet daily as needed. -     amphetamine -dextroamphetamine  (ADDERALL) 20 MG tablet; Take 1/2 to one tablet daily as needed. -     amphetamine -dextroamphetamine  (ADDERALL XR) 30 MG 24 hr capsule; Take 1 capsule (30 mg total) by mouth daily.  Generalized anxiety disorder -     busPIRone  (BUSPAR ) 5 MG tablet; Take 1 tablet (5 mg total) by mouth 3 (three) times daily.     Please see After Visit Summary for patient specific instructions.  No future appointments.   No orders of the defined  types were placed in this encounter.     -------------------------------

## 2024-04-22 ENCOUNTER — Telehealth: Payer: Self-pay | Admitting: Adult Health

## 2024-04-22 NOTE — Telephone Encounter (Signed)
 Pt appt 1/21   Needs rf of Adderall    [facility] [place] [address] [address] [place]

## 2024-04-23 NOTE — Telephone Encounter (Signed)
 Pt has 2 different doses of Adderall, XR 30 and 20. Does he want both doses?

## 2024-04-25 ENCOUNTER — Other Ambulatory Visit: Payer: Self-pay

## 2024-04-25 DIAGNOSIS — F900 Attention-deficit hyperactivity disorder, predominantly inattentive type: Secondary | ICD-10-CM

## 2024-04-25 MED ORDER — AMPHETAMINE-DEXTROAMPHETAMINE 20 MG PO TABS: ORAL_TABLET | ORAL | 0 refills | Status: AC

## 2024-04-25 MED ORDER — AMPHETAMINE-DEXTROAMPHET ER 30 MG PO CP24: 30.0000 mg | ORAL_CAPSULE | Freq: Every day | ORAL | 0 refills | Status: AC

## 2024-04-25 NOTE — Telephone Encounter (Signed)
 Called pt and he does want both doses of Adderall - XR and IR.

## 2024-04-25 NOTE — Telephone Encounter (Signed)
 Pended

## 2024-05-04 ENCOUNTER — Telehealth (INDEPENDENT_AMBULATORY_CARE_PROVIDER_SITE_OTHER): Admitting: Adult Health

## 2024-05-04 ENCOUNTER — Encounter: Payer: Self-pay | Admitting: Adult Health

## 2024-05-04 DIAGNOSIS — F900 Attention-deficit hyperactivity disorder, predominantly inattentive type: Secondary | ICD-10-CM

## 2024-05-04 DIAGNOSIS — F411 Generalized anxiety disorder: Secondary | ICD-10-CM | POA: Diagnosis not present

## 2024-05-04 DIAGNOSIS — F909 Attention-deficit hyperactivity disorder, unspecified type: Secondary | ICD-10-CM | POA: Diagnosis not present

## 2024-05-04 MED ORDER — HYDROXYZINE HCL 25 MG PO TABS: ORAL_TABLET | ORAL | 2 refills | Status: AC

## 2024-05-04 NOTE — Progress Notes (Signed)
 Blake Yu [id number] [birth year] 18 y.o.  Virtual Visit via Video Note  I connected with pt @ on 05/04/24 at  4:00 PM EST by a video enabled telemedicine application and verified that I am speaking with the correct person using two identifiers.   I discussed the limitations of evaluation and management by telemedicine and the availability of in person appointments. The patient expressed understanding and agreed to proceed.  I discussed the assessment and treatment plan with the patient. The patient was provided an opportunity to ask questions and all were answered. The patient agreed with the plan and demonstrated an understanding of the instructions.   The patient was advised to call back or seek an in-person evaluation if the symptoms worsen or if the condition fails to improve as anticipated.  I provided 25 minutes of non-face-to-face time during this encounter.  The patient was located at home.  The provider was located at [facility].   Angeline LOISE Sayers, NP   Subjective:   Patient ID:  Blake Yu is a 19 y.o. (DOB 29-Feb-2006) male.  Chief Complaint: No chief complaint on file.   HPI Blake Yu presents for follow-up of ADD and GAD.  Describes mood today as ok. Pleasant. Denies tearfulness. Mood symptoms - denies depression. Reports stable interest and motivation. Reports anxiety and irritability at times, but not to the degree it had been. Reports some social anxiety. Denies panic attacks. Denies procrastination. Denies over thinking. Denies worry and rumination. Reports mood as stable - very good. Stating I feel like I'm doing ok. Reports current medication regimen is helpful - continues to take Adderall, but has stopped all other medications. Taking medication as prescribed. Energy levels stable. Active, does not have a regular exercise routine.   Enjoys some usual interests and activities. Single. Living with roommates. Spending time with family. Appetite  adequate. Weight stable - 108 pounds - 65. Sleeps well most nights. Averages 6 to 8 hours. Reports focus and concentration improved with Adderall. Completing tasks. 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  Denies SI or HI.  Denies AH or VH. Denies self harm. Denies substance use.  Previous medication trials:   Wellbutrin XL 150mg  dail  Review of Systems:  Review of Systems  Musculoskeletal:  Negative for gait problem.  Neurological:  Negative for tremors.  Psychiatric/Behavioral:         Please refer to HPI    Medications: I have reviewed the patient's current medications.  Current Outpatient Medications  Medication Sig Dispense Refill   amphetamine -dextroamphetamine  (ADDERALL XR) 30 MG 24 hr capsule Take 1 capsule (30 mg total) by mouth daily. 30 capsule 0   amphetamine -dextroamphetamine  (ADDERALL XR) 30 MG 24 hr capsule Take 1 capsule (30 mg total) by mouth daily. 30 capsule 0   amphetamine -dextroamphetamine  (ADDERALL XR) 30 MG 24 hr capsule Take 1 capsule (30 mg total) by mouth daily. 30 capsule 0   amphetamine -dextroamphetamine  (ADDERALL) 20 MG tablet Take 1/2 to one tablet daily as needed. 30 tablet 0   busPIRone  (BUSPAR ) 5 MG tablet Take 1 tablet (5 mg total) by mouth 3 (three) times daily. 90 tablet 2   FLUoxetine  (PROZAC ) 40 MG capsule Take 1 capsule (40 mg total) by mouth daily. 30 capsule 2   Ibuprofen  (MOTRIN  PO) Take 10 mLs by mouth every 6 (six) hours as needed. For pain/fever     propranolol  (INDERAL ) 10 MG tablet Take 1 tablet (10 mg total) by mouth  3 (three) times daily. 90 tablet 2   No current facility-administered medications for this visit.    Medication Side Effects: None  Allergies: Allergies[1]  No past medical history on file.  No family history on file.  Social History   Socioeconomic History   Marital status: Single    Spouse name: Not on file   Number of children: Not on file    Years of education: Not on file   Highest education level: Not on file  Occupational History   Not on file  Tobacco Use   Smoking status: Never   Smokeless tobacco: Not on file  Substance and Sexual Activity   Alcohol use: Not on file   Drug use: Not on file   Sexual activity: Not on file  Other Topics Concern   Not on file  Social History Narrative   Not on file   Social Drivers of Health   Tobacco Use: Unknown (03/09/2024)   Patient History    Smoking Tobacco Use: Never    Smokeless Tobacco Use: Unknown    Passive Exposure: Not on file  Financial Resource Strain: Not on file  Food Insecurity: Not on file  Transportation Needs: Not on file  Physical Activity: Not on file  Stress: Not on file  Social Connections: Not on file  Intimate Partner Violence: Not on file  Depression (EYV7-0): Not on file  Alcohol Screen: Not on file  Housing: Not on file  Utilities: Not on file  Health Literacy: Not on file    Past Medical History, Surgical history, Social history, and Family history were reviewed and updated as appropriate.   Please see review of systems for further details on the patient's review from today.   Objective:   Physical Exam:  There were no vitals taken for this visit.  Physical Exam Constitutional:      General: He is not in acute distress. Musculoskeletal:        General: No deformity.  Neurological:     Mental Status: He is alert and oriented to person, place, and time.     Coordination: Coordination normal.  Psychiatric:        Attention and Perception: Attention and perception normal. He does not perceive auditory or visual hallucinations.        Mood and Affect: Mood normal. Mood is not anxious or depressed. Affect is not labile, blunt, angry or inappropriate.        Speech: Speech normal.        Behavior: Behavior normal.        Thought Content: Thought content normal. Thought content is not paranoid or delusional. Thought content does not  include homicidal or suicidal ideation. Thought content does not include homicidal or suicidal plan.        Cognition and Memory: Cognition and memory normal.        Judgment: Judgment normal.     Comments: Insight intact     Lab Review:  No results found for: NA, K, CL, CO2, GLUCOSE, BUN, CREATININE, CALCIUM, PROT, ALBUMIN, AST, ALT, ALKPHOS, BILITOT, GFRNONAA, GFRAA  No results found for: WBC, RBC, HGB, HCT, PLT, MCV, MCH, MCHC, RDW, LYMPHSABS, MONOABS, EOSABS, BASOSABS  No results found for: POCLITH, LITHIUM   No results found for: PHENYTOIN, PHENOBARB, VALPROATE, CBMZ   .res Assessment: Plan:    Treatment Plan/Recommendations:   Plan:  PDMP reviewed  Continue: Adderall XR 30mg  daily   Adderall 20mg  daily  Add: Hydroxyzine  25mg  at hs for anxiety  Discontinue: Prozac  40mg  daily Buspar  5mg  TID for anxiety Propranolol  10mg  TID prn anxiety  RTC 4 weeks  Patient meets DSM criteria for ADD. Screening tools indicated ADHD likely.  25 minutes spent dedicated to the care of this patient on the date of this encounter to include pre-visit review of records, ordering of medication, post visit documentation, and face-to-face time with the patient discussing ADD. Discussed continuing current medication regimen.  Patient advised to contact office with any questions, adverse effects, or acute worsening in signs and symptoms.  There are no diagnoses linked to this encounter.   Please see After Visit Summary for patient specific instructions.  Future Appointments  Date Time Provider Department Center  05/04/2024  4:00 PM Huong Luthi Nattalie, NP [facility] None    No orders of the defined types were placed in this encounter.     -------------------------------      [1] No Known Allergies

## 2024-06-06 ENCOUNTER — Telehealth: Payer: Self-pay | Admitting: Adult Health
